# Patient Record
Sex: Male | Born: 1965
Health system: Southern US, Community
[De-identification: ages and names within clinical notes are randomized; demographics above are authoritative.]

## PROBLEM LIST (undated history)

## (undated) DIAGNOSIS — I10 Essential (primary) hypertension: Secondary | ICD-10-CM

## (undated) DIAGNOSIS — E119 Type 2 diabetes mellitus without complications: Secondary | ICD-10-CM

## (undated) HISTORY — PX: OTHER SURGICAL HISTORY: SHX169

---

## 2016-10-18 ENCOUNTER — Emergency Department (HOSPITAL_COMMUNITY): Payer: BLUE CROSS/BLUE SHIELD

## 2016-10-18 ENCOUNTER — Encounter (HOSPITAL_COMMUNITY): Payer: Self-pay | Admitting: *Deleted

## 2016-10-18 ENCOUNTER — Emergency Department (HOSPITAL_COMMUNITY)
Admission: EM | Admit: 2016-10-18 | Discharge: 2016-10-18 | Disposition: A | Discharge status: Home / Self Care | Payer: BLUE CROSS/BLUE SHIELD | Attending: Emergency Medicine | Admitting: Emergency Medicine

## 2016-10-18 DIAGNOSIS — R110 Nausea: Secondary | ICD-10-CM

## 2016-10-18 DIAGNOSIS — R079 Chest pain, unspecified: Secondary | ICD-10-CM | POA: Diagnosis present

## 2016-10-18 DIAGNOSIS — R42 Dizziness and giddiness: Secondary | ICD-10-CM

## 2016-10-18 HISTORY — DX: Type 2 diabetes mellitus without complications: E11.9

## 2016-10-18 HISTORY — DX: Essential (primary) hypertension: I10

## 2016-10-18 LAB — CBC
HCT: 43.5 % (ref 39.0–52.0)
Hemoglobin: 14.6 g/dL (ref 13.0–17.0)
MCH: 29.1 pg (ref 26.0–34.0)
MCHC: 33.6 g/dL (ref 30.0–36.0)
MCV: 86.8 fL (ref 78.0–100.0)
PLATELETS: 210 10*3/uL (ref 150–400)
RBC: 5.01 MIL/uL (ref 4.22–5.81)
RDW: 13.1 % (ref 11.5–15.5)
WBC: 6.9 10*3/uL (ref 4.0–10.5)

## 2016-10-18 LAB — I-STAT TROPONIN, ED: TROPONIN I, POC: 0 ng/mL (ref 0.00–0.08)

## 2016-10-18 LAB — BASIC METABOLIC PANEL
Anion gap: 10 (ref 5–15)
BUN: 23 mg/dL — AB (ref 6–20)
CO2: 21 mmol/L — ABNORMAL LOW (ref 22–32)
CREATININE: 0.86 mg/dL (ref 0.61–1.24)
Calcium: 9.6 mg/dL (ref 8.9–10.3)
Chloride: 106 mmol/L (ref 101–111)
Glucose, Bld: 199 mg/dL — ABNORMAL HIGH (ref 65–99)
Potassium: 4.1 mmol/L (ref 3.5–5.1)
SODIUM: 137 mmol/L (ref 135–145)

## 2016-10-18 LAB — CBG MONITORING, ED: GLUCOSE-CAPILLARY: 209 mg/dL — AB (ref 65–99)

## 2016-10-18 MED ORDER — MECLIZINE HCL 25 MG PO TABS: 25.0000 mg | ORAL_TABLET | Freq: Three times a day (TID) | ORAL | 0 refills | Status: DC | PRN

## 2016-10-18 MED ORDER — SODIUM CHLORIDE 0.9 % IV BOLUS (SEPSIS)
1000.0000 mL | Freq: Once | INTRAVENOUS | Status: AC
Start: 2016-10-18 — End: 2016-10-18
  Administered 2016-10-18: 1000 mL via INTRAVENOUS

## 2016-10-18 MED ORDER — ONDANSETRON 4 MG PO TBDP
4.0000 mg | ORAL_TABLET | Freq: Once | ORAL | Status: AC
  Administered 2016-10-18: 4 mg via ORAL

## 2016-10-18 MED ORDER — ONDANSETRON 4 MG PO TBDP
ORAL_TABLET | ORAL | Status: AC
  Filled 2016-10-18: qty 1

## 2016-10-18 MED ORDER — MECLIZINE HCL 25 MG PO TABS
25.0000 mg | ORAL_TABLET | Freq: Once | ORAL | Status: AC
  Administered 2016-10-18: 25 mg via ORAL
  Filled 2016-10-18: qty 1
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
## 2016-10-18 NOTE — ED Notes (Signed)
Pt in room and changing in gown at this time.

## 2016-10-18 NOTE — ED Notes (Signed)
Pt ambulatory on discharge with no complaints. VSS.

## 2016-10-18 NOTE — Discharge Instructions (Signed)
RETURN TO ER IF YOU HAVE ANY BAD HEADACHE, SUDDEN VISUAL CHANGES, NECK PAIN ASSOCIATED WITH HEADACHE, ARM OR LEG WEAKNESS, OR REPEATED VOMITING.

## 2016-10-18 NOTE — ED Triage Notes (Signed)
Pt states acute onset dizziness and chest pressure 1130. Symptoms accompanied by nausea and last a minute, intermittent and increased with movement.  No focal deficits.  No headache, though states some blurred vision in both eyes.

## 2017-01-18 ENCOUNTER — Ambulatory Visit (INDEPENDENT_AMBULATORY_CARE_PROVIDER_SITE_OTHER): Payer: BLUE CROSS/BLUE SHIELD | Admitting: Physician Assistant

## 2017-01-18 ENCOUNTER — Encounter: Payer: Self-pay | Admitting: Physician Assistant

## 2017-01-18 VITALS — BP 126/74 | HR 74 | Temp 98.1°F | Resp 18 | Ht 71.0 in | Wt 243.0 lb

## 2017-01-18 DIAGNOSIS — Z23 Encounter for immunization: Secondary | ICD-10-CM | POA: Diagnosis not present

## 2017-01-18 DIAGNOSIS — Z7689 Persons encountering health services in other specified circumstances: Secondary | ICD-10-CM

## 2017-01-18 DIAGNOSIS — Z794 Long term (current) use of insulin: Secondary | ICD-10-CM

## 2017-01-18 DIAGNOSIS — Z0000 Encounter for general adult medical examination without abnormal findings: Secondary | ICD-10-CM

## 2017-01-18 DIAGNOSIS — E119 Type 2 diabetes mellitus without complications: Secondary | ICD-10-CM | POA: Diagnosis not present

## 2017-01-18 MED ORDER — LISINOPRIL 10 MG PO TABS: 10.0000 mg | ORAL_TABLET | Freq: Every day | ORAL | 3 refills | Status: DC

## 2017-01-18 MED ORDER — METFORMIN HCL 500 MG PO TABS: 500.0000 mg | ORAL_TABLET | Freq: Two times a day (BID) | ORAL | 1 refills | Status: DC

## 2017-01-18 MED ORDER — ASPIRIN 81 MG PO TABS: 81.0000 mg | ORAL_TABLET | Freq: Every day | ORAL | 11 refills | Status: AC
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
## 2017-01-19 ENCOUNTER — Other Ambulatory Visit: Payer: BLUE CROSS/BLUE SHIELD

## 2017-01-19 DIAGNOSIS — Z0000 Encounter for general adult medical examination without abnormal findings: Secondary | ICD-10-CM

## 2017-01-19 DIAGNOSIS — E119 Type 2 diabetes mellitus without complications: Secondary | ICD-10-CM

## 2017-01-19 LAB — MICROALBUMIN, URINE: Microalb, Ur: <0.2 mg/dL

## 2017-01-20 LAB — COMPLETE METABOLIC PANEL WITH GFR
AG RATIO: 1.4 (calc) (ref 1.0–2.5)
ALBUMIN MSPROF: 3.6 g/dL (ref 3.6–5.1)
ALKALINE PHOSPHATASE (APISO): 81 U/L (ref 40–115)
ALT: 21 U/L (ref 9–46)
AST: 16 U/L (ref 10–35)
BILIRUBIN TOTAL: 0.6 mg/dL (ref 0.2–1.2)
BUN: 20 mg/dL (ref 7–25)
CHLORIDE: 101 mmol/L (ref 98–110)
CO2: 25 mmol/L (ref 20–32)
Calcium: 8.9 mg/dL (ref 8.6–10.3)
Creat: 0.9 mg/dL (ref 0.70–1.33)
GFR, Est African American: 115 mL/min/{1.73_m2} (ref >=60)
GFR, Est Non African American: 99 mL/min/{1.73_m2} (ref >=60)
GLOBULIN: 2.6 g/dL (ref 1.9–3.7)
GLUCOSE: 316 mg/dL — AB (ref 65–99)
POTASSIUM: 4.3 mmol/L (ref 3.5–5.3)
SODIUM: 134 mmol/L — AB (ref 135–146)
Total Protein: 6.2 g/dL (ref 6.1–8.1)

## 2017-01-20 LAB — CBC WITH DIFFERENTIAL/PLATELET
Basophils Absolute: 18 cells/uL (ref 0–200)
Basophils Relative: 0.3 %
EOS PCT: 2 %
Eosinophils Absolute: 120 cells/uL (ref 15–500)
HCT: 44.5 % (ref 38.5–50.0)
Hemoglobin: 14.8 g/dL (ref 13.2–17.1)
Lymphs Abs: 1626 cells/uL (ref 850–3900)
MCH: 28.5 pg (ref 27.0–33.0)
MCHC: 33.3 g/dL (ref 32.0–36.0)
MCV: 85.7 fL (ref 80.0–100.0)
MPV: 11.4 fL (ref 7.5–12.5)
Monocytes Relative: 9.7 %
NEUTROS PCT: 60.9 %
Neutro Abs: 3654 cells/uL (ref 1500–7800)
PLATELETS: 183 10*3/uL (ref 140–400)
RBC: 5.19 10*6/uL (ref 4.20–5.80)
RDW: 12.4 % (ref 11.0–15.0)
TOTAL LYMPHOCYTE: 27.1 %
WBC mixed population: 582 cells/uL (ref 200–950)
WBC: 6 10*3/uL (ref 3.8–10.8)

## 2017-01-20 LAB — HEMOGLOBIN A1C
Hgb A1c MFr Bld: 13.7 % of total Hgb — ABNORMAL HIGH (ref <5.7)
Mean Plasma Glucose: 346 (calc)
eAG (mmol/L): 19.2 (calc)

## 2017-01-20 LAB — LIPID PANEL
CHOL/HDL RATIO: 6.9 (calc) — AB (ref <5.0)
Cholesterol: 263 mg/dL — ABNORMAL HIGH (ref <200)
HDL: 38 mg/dL — AB (ref >40)
NON-HDL CHOLESTEROL (CALC): 225 mg/dL — AB (ref <130)
Triglycerides: 497 mg/dL — ABNORMAL HIGH (ref <150)

## 2017-01-20 LAB — PSA: PSA: 0.4 ng/mL (ref <=4.0)

## 2017-01-20 LAB — TSH: TSH: 0.89 mIU/L (ref 0.40–4.50)

## 2017-01-22 ENCOUNTER — Encounter: Payer: Self-pay | Admitting: Physician Assistant

## 2017-01-22 ENCOUNTER — Ambulatory Visit (INDEPENDENT_AMBULATORY_CARE_PROVIDER_SITE_OTHER): Payer: BLUE CROSS/BLUE SHIELD | Admitting: Physician Assistant

## 2017-01-22 VITALS — BP 100/66 | HR 74 | Temp 98.3°F | Resp 16 | Ht 71.0 in | Wt 231.0 lb

## 2017-01-22 DIAGNOSIS — E119 Type 2 diabetes mellitus without complications: Secondary | ICD-10-CM

## 2017-01-22 DIAGNOSIS — Z794 Long term (current) use of insulin: Secondary | ICD-10-CM | POA: Diagnosis not present

## 2017-01-22 DIAGNOSIS — N521 Erectile dysfunction due to diseases classified elsewhere: Secondary | ICD-10-CM | POA: Diagnosis not present

## 2017-01-22 DIAGNOSIS — N529 Male erectile dysfunction, unspecified: Secondary | ICD-10-CM | POA: Insufficient documentation

## 2017-01-22 MED ORDER — INSULIN DEGLUDEC 200 UNIT/ML ~~LOC~~ SOPN: 40.0000 [IU] | PEN_INJECTOR | Freq: Every day | SUBCUTANEOUS | 3 refills | Status: DC

## 2017-01-22 MED ORDER — SILDENAFIL CITRATE 100 MG PO TABS: 50.0000 mg | ORAL_TABLET | Freq: Every day | ORAL | 11 refills | Status: DC | PRN

## 2017-01-22 MED ORDER — METFORMIN HCL 1000 MG PO TABS: 1000.0000 mg | ORAL_TABLET | Freq: Two times a day (BID) | ORAL | 3 refills | Status: DC
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
## 2017-01-24 ENCOUNTER — Telehealth: Payer: Self-pay | Admitting: *Deleted

## 2017-01-24 DIAGNOSIS — E119 Type 2 diabetes mellitus without complications: Secondary | ICD-10-CM

## 2017-01-24 DIAGNOSIS — Z794 Long term (current) use of insulin: Principal | ICD-10-CM

## 2017-01-24 NOTE — Telephone Encounter (Signed)
Received request from pharmacy for PA on Tresiba.  PA submitted.   Dx: E11.65- DM with hyperglycemia (A1C >14%)  Evaristo Buryresiba is now an excellent alternative for what doctors call basal insulin. Basal insulin is a type of insulin that lasts long periods of time and helps control blood sugars between meals and overnight. Several changes were made to slow the absorption of Tresiba, making its effect on lower blood sugar more predictable. As compared to regular insulin, Evaristo Buryresiba contains an amino acid deletion and a special bond with a fatty acid. When combined with zinc and phenol, degludec (the generic name of Evaristo Buryresiba) forms multihexamers under the skin. Once absorbed, it reversibly binds to the main protein in the blood, called albumin. The end result of these modifications is a new class of insulin referred to as "ultra-long-acting basal insulin".

## 2017-01-24 NOTE — Telephone Encounter (Signed)
Your PA has been faxed to the plan as a paper copy. Please contact the plan directly if you haven't received a determination in a typical timeframe.  You will be notified of the determination electronically and via fax. 

## 2017-01-30 MED ORDER — INSULIN DEGLUDEC 200 UNIT/ML ~~LOC~~ SOPN: 40.0000 [IU] | PEN_INJECTOR | Freq: Every day | SUBCUTANEOUS | 3 refills | Status: DC

## 2017-01-30 NOTE — Telephone Encounter (Signed)
Received PA determination.  ° °PA approved.  ° °Pharmacy made aware.  °

## 2017-02-01 ENCOUNTER — Telehealth: Payer: Self-pay | Admitting: *Deleted

## 2017-02-01 NOTE — Telephone Encounter (Signed)
Your PA has been faxed to the plan as a paper copy. Please contact the plan directly if you haven't received a determination in a typical timeframe.  You will be notified of the determination via fax. 

## 2017-02-01 NOTE — Telephone Encounter (Signed)
Received request from pharmacy for PA on Sildenafil.   PA submitted.   Dx: N52.1- ED

## 2017-02-05 ENCOUNTER — Ambulatory Visit (INDEPENDENT_AMBULATORY_CARE_PROVIDER_SITE_OTHER): Payer: BLUE CROSS/BLUE SHIELD | Admitting: Physician Assistant

## 2017-02-05 ENCOUNTER — Encounter: Payer: Self-pay | Admitting: Physician Assistant

## 2017-02-05 VITALS — BP 108/78 | HR 71 | Temp 98.3°F | Resp 16 | Ht 71.0 in | Wt 234.6 lb

## 2017-02-05 DIAGNOSIS — M7022 Olecranon bursitis, left elbow: Secondary | ICD-10-CM

## 2017-02-05 DIAGNOSIS — N529 Male erectile dysfunction, unspecified: Secondary | ICD-10-CM

## 2017-02-05 DIAGNOSIS — Z794 Long term (current) use of insulin: Secondary | ICD-10-CM

## 2017-02-05 DIAGNOSIS — E119 Type 2 diabetes mellitus without complications: Secondary | ICD-10-CM

## 2017-02-05 MED ORDER — PREDNISONE 20 MG PO TABS
ORAL_TABLET | ORAL | 0 refills | Status: DC
Start: 2017-02-05 — End: 2017-04-19

## 2017-02-05 MED ORDER — SILDENAFIL CITRATE 20 MG PO TABS: ORAL_TABLET | ORAL | 2 refills | Status: DC
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
## 2017-02-08 ENCOUNTER — Telehealth: Payer: Self-pay

## 2017-02-08 NOTE — Telephone Encounter (Signed)
Drug Sildenafil Citrate  OR TABS Form [employer] [place] Form Original Claim Info 75 PA assistance available. Resubmit claim with 8 in PA Type Code and 4444 in PriorAuthNumber

## 2017-02-08 NOTE — Telephone Encounter (Signed)
No I just did this today

## 2017-02-14 NOTE — Telephone Encounter (Signed)
Received PA determination.   PA for Sidenafil is denied. Request can only be approved if medication is being used to treat pulmonary arterial hypertension.   Patient will have to pay out of pocket for medication.

## 2017-04-19 ENCOUNTER — Ambulatory Visit (INDEPENDENT_AMBULATORY_CARE_PROVIDER_SITE_OTHER): Payer: BLUE CROSS/BLUE SHIELD | Admitting: Physician Assistant

## 2017-04-19 ENCOUNTER — Other Ambulatory Visit: Payer: Self-pay

## 2017-04-19 ENCOUNTER — Encounter: Payer: Self-pay | Admitting: Physician Assistant

## 2017-04-19 VITALS — BP 110/78 | HR 88 | Temp 98.8°F | Resp 16 | Wt 236.8 lb

## 2017-04-19 DIAGNOSIS — E119 Type 2 diabetes mellitus without complications: Secondary | ICD-10-CM | POA: Diagnosis not present

## 2017-04-19 DIAGNOSIS — Z794 Long term (current) use of insulin: Secondary | ICD-10-CM | POA: Diagnosis not present

## 2017-04-20 ENCOUNTER — Other Ambulatory Visit: Payer: BLUE CROSS/BLUE SHIELD

## 2017-04-20 DIAGNOSIS — Z794 Long term (current) use of insulin: Principal | ICD-10-CM

## 2017-04-20 DIAGNOSIS — E119 Type 2 diabetes mellitus without complications: Secondary | ICD-10-CM

## 2017-04-20 MED ORDER — SILDENAFIL CITRATE 20 MG PO TABS: ORAL_TABLET | ORAL | 3 refills | Status: AC
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
## 2017-04-21 LAB — HEPATIC FUNCTION PANEL
AG Ratio: 1.6 (calc) (ref 1.0–2.5)
ALBUMIN MSPROF: 3.9 g/dL (ref 3.6–5.1)
ALKALINE PHOSPHATASE (APISO): 53 U/L (ref 40–115)
ALT: 15 U/L (ref 9–46)
AST: 16 U/L (ref 10–35)
BILIRUBIN DIRECT: 0.1 mg/dL (ref 0.0–0.2)
BILIRUBIN TOTAL: 0.5 mg/dL (ref 0.2–1.2)
Globulin: 2.4 g/dL (calc) (ref 1.9–3.7)
Indirect Bilirubin: 0.4 mg/dL (calc) (ref 0.2–1.2)
Total Protein: 6.3 g/dL (ref 6.1–8.1)

## 2017-04-21 LAB — LIPID PANEL
Cholesterol: 192 mg/dL (ref <200)
HDL: 50 mg/dL (ref >40)
LDL Cholesterol (Calc): 117 mg/dL (calc) — ABNORMAL HIGH
Non-HDL Cholesterol (Calc): 142 mg/dL (calc) — ABNORMAL HIGH (ref <130)
Total CHOL/HDL Ratio: 3.8 (calc) (ref <5.0)
Triglycerides: 140 mg/dL (ref <150)

## 2017-04-21 LAB — HEMOGLOBIN A1C
Hgb A1c MFr Bld: 7.7 % of total Hgb — ABNORMAL HIGH (ref <5.7)
Mean Plasma Glucose: 174 (calc)
eAG (mmol/L): 9.7 (calc)

## 2017-04-25 ENCOUNTER — Other Ambulatory Visit: Payer: Self-pay

## 2017-04-25 DIAGNOSIS — Z794 Long term (current) use of insulin: Principal | ICD-10-CM

## 2017-04-25 DIAGNOSIS — E119 Type 2 diabetes mellitus without complications: Secondary | ICD-10-CM

## 2017-04-25 MED ORDER — METFORMIN HCL 1000 MG PO TABS: 1000.0000 mg | ORAL_TABLET | Freq: Two times a day (BID) | ORAL | 3 refills | Status: AC

## 2017-04-25 MED ORDER — SIMVASTATIN 10 MG PO TABS: 10.0000 mg | ORAL_TABLET | Freq: Every day | ORAL | 0 refills | Status: DC

## 2017-04-26 ENCOUNTER — Telehealth: Payer: Self-pay

## 2017-04-26 NOTE — Telephone Encounter (Signed)
Prior authorization started for sildenafil 20 mg  Drug Sildenafil Citrate 20MG  OR TABS Form [employer] Electronic PA Form   Hoyle SauerAlan Meininger Key: Corena [id number] - PA Case ID: [id number] - Rx #: [id number]

## 2017-05-16 NOTE — Telephone Encounter (Addendum)
Prior authorization for Sildenafil 20 mg has been denied due to medical necessity and patient not having pulmonary arterial dysfunction.   Pharmacy aware they tried to run it through as brand and generic name

## 2017-06-06 ENCOUNTER — Other Ambulatory Visit: Payer: BLUE CROSS/BLUE SHIELD

## 2017-06-06 DIAGNOSIS — Z794 Long term (current) use of insulin: Principal | ICD-10-CM

## 2017-06-06 DIAGNOSIS — E119 Type 2 diabetes mellitus without complications: Secondary | ICD-10-CM

## 2017-06-07 LAB — LIPID PANEL
Cholesterol: 174 mg/dL (ref <200)
HDL: 51 mg/dL (ref >40)
LDL Cholesterol (Calc): 95 mg/dL (calc)
NON-HDL CHOLESTEROL (CALC): 123 mg/dL (ref <130)
Total CHOL/HDL Ratio: 3.4 (calc) (ref <5.0)
Triglycerides: 185 mg/dL — ABNORMAL HIGH (ref <150)

## 2017-06-07 LAB — HEPATIC FUNCTION PANEL
AG RATIO: 1.7 (calc) (ref 1.0–2.5)
ALKALINE PHOSPHATASE (APISO): 56 U/L (ref 40–115)
ALT: 18 U/L (ref 9–46)
AST: 20 U/L (ref 10–35)
Albumin: 4 g/dL (ref 3.6–5.1)
Bilirubin, Direct: 0.1 mg/dL (ref 0.0–0.2)
GLOBULIN: 2.3 g/dL (ref 1.9–3.7)
Indirect Bilirubin: 0.3 mg/dL (calc) (ref 0.2–1.2)
TOTAL PROTEIN: 6.3 g/dL (ref 6.1–8.1)
Total Bilirubin: 0.4 mg/dL (ref 0.2–1.2)

## 2017-06-22 ENCOUNTER — Other Ambulatory Visit: Payer: Self-pay

## 2017-06-22 MED ORDER — INSULIN PEN NEEDLE 32G X 4 MM MISC: 5 refills | Status: DC

## 2017-07-19 ENCOUNTER — Encounter: Payer: Self-pay | Admitting: Physician Assistant

## 2017-07-19 ENCOUNTER — Other Ambulatory Visit: Payer: Self-pay

## 2017-07-19 ENCOUNTER — Ambulatory Visit: Payer: BLUE CROSS/BLUE SHIELD | Admitting: Physician Assistant

## 2017-07-19 VITALS — BP 128/72 | HR 66 | Temp 98.1°F | Resp 16 | Ht 71.0 in | Wt 234.0 lb

## 2017-07-19 DIAGNOSIS — I10 Essential (primary) hypertension: Secondary | ICD-10-CM | POA: Diagnosis not present

## 2017-07-19 DIAGNOSIS — E785 Hyperlipidemia, unspecified: Secondary | ICD-10-CM

## 2017-07-19 DIAGNOSIS — N529 Male erectile dysfunction, unspecified: Secondary | ICD-10-CM | POA: Diagnosis not present

## 2017-07-19 DIAGNOSIS — E119 Type 2 diabetes mellitus without complications: Secondary | ICD-10-CM | POA: Diagnosis not present

## 2017-07-19 DIAGNOSIS — Z794 Long term (current) use of insulin: Secondary | ICD-10-CM
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
## 2017-07-20 LAB — HEMOGLOBIN A1C
EAG (MMOL/L): 8.2 (calc)
Hgb A1c MFr Bld: 6.8 % of total Hgb — ABNORMAL HIGH (ref <5.7)
Mean Plasma Glucose: 148 (calc)

## 2018-02-21 ENCOUNTER — Encounter: Payer: Self-pay | Admitting: Physician Assistant

## 2018-02-21 ENCOUNTER — Ambulatory Visit: Payer: BLUE CROSS/BLUE SHIELD | Admitting: Physician Assistant

## 2018-02-21 VITALS — BP 118/72 | HR 67 | Temp 98.3°F | Resp 16 | Wt 239.1 lb

## 2018-02-21 DIAGNOSIS — E785 Hyperlipidemia, unspecified: Secondary | ICD-10-CM

## 2018-02-21 DIAGNOSIS — Z125 Encounter for screening for malignant neoplasm of prostate: Secondary | ICD-10-CM

## 2018-02-21 DIAGNOSIS — I10 Essential (primary) hypertension: Secondary | ICD-10-CM

## 2018-02-21 DIAGNOSIS — N529 Male erectile dysfunction, unspecified: Secondary | ICD-10-CM

## 2018-02-21 DIAGNOSIS — Z794 Long term (current) use of insulin: Secondary | ICD-10-CM

## 2018-02-21 DIAGNOSIS — E119 Type 2 diabetes mellitus without complications: Secondary | ICD-10-CM

## 2018-02-21 MED ORDER — SIMVASTATIN 10 MG PO TABS: 10.0000 mg | ORAL_TABLET | Freq: Every day | ORAL | 1 refills | Status: DC
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
## 2018-02-21 NOTE — Addendum Note (Signed)
Addended by: Allayne Butcher B on: 02/21/2018 08:47 AM   Modules accepted: Orders

## 2018-02-22 LAB — HEMOGLOBIN A1C
HEMOGLOBIN A1C: 7.4 %{Hb} — AB (ref <5.7)
Mean Plasma Glucose: 166 (calc)
eAG (mmol/L): 9.2 (calc)

## 2018-02-22 LAB — COMPLETE METABOLIC PANEL WITH GFR
AG RATIO: 1.6 (calc) (ref 1.0–2.5)
ALT: 25 U/L (ref 9–46)
AST: 24 U/L (ref 10–35)
Albumin: 4.1 g/dL (ref 3.6–5.1)
Alkaline phosphatase (APISO): 63 U/L (ref 40–115)
BILIRUBIN TOTAL: 0.4 mg/dL (ref 0.2–1.2)
BUN: 24 mg/dL (ref 7–25)
CALCIUM: 9.5 mg/dL (ref 8.6–10.3)
CHLORIDE: 105 mmol/L (ref 98–110)
CO2: 22 mmol/L (ref 20–32)
Creat: 0.89 mg/dL (ref 0.70–1.33)
GFR, EST AFRICAN AMERICAN: 115 mL/min/{1.73_m2} (ref >=60)
GFR, Est Non African American: 99 mL/min/{1.73_m2} (ref >=60)
GLUCOSE: 124 mg/dL — AB (ref 65–99)
Globulin: 2.5 g/dL (calc) (ref 1.9–3.7)
POTASSIUM: 4.4 mmol/L (ref 3.5–5.3)
Sodium: 139 mmol/L (ref 135–146)
TOTAL PROTEIN: 6.6 g/dL (ref 6.1–8.1)

## 2018-02-22 LAB — LIPID PANEL
Cholesterol: 220 mg/dL — ABNORMAL HIGH (ref <200)
HDL: 41 mg/dL (ref >40)
LDL Cholesterol (Calc): 139 mg/dL (calc) — ABNORMAL HIGH
Non-HDL Cholesterol (Calc): 179 mg/dL (calc) — ABNORMAL HIGH (ref <130)
TRIGLYCERIDES: 262 mg/dL — AB (ref <150)
Total CHOL/HDL Ratio: 5.4 (calc) — ABNORMAL HIGH (ref <5.0)

## 2018-02-22 LAB — MICROALBUMIN, URINE: Microalb, Ur: 1.1 mg/dL

## 2018-02-22 LAB — PSA: PSA: 0.6 ng/mL (ref <=4.0)

## 2018-02-27 ENCOUNTER — Other Ambulatory Visit: Payer: Self-pay

## 2018-02-27 MED ORDER — SIMVASTATIN 10 MG PO TABS: 10.0000 mg | ORAL_TABLET | Freq: Every day | ORAL | 1 refills | Status: AC

## 2018-02-27 MED ORDER — SITAGLIPTIN PHOSPHATE 100 MG PO TABS: 100.0000 mg | ORAL_TABLET | Freq: Every day | ORAL | 5 refills | Status: AC

## 2018-04-22 DIAGNOSIS — E785 Hyperlipidemia, unspecified: Secondary | ICD-10-CM | POA: Diagnosis not present

## 2018-04-22 DIAGNOSIS — E119 Type 2 diabetes mellitus without complications: Secondary | ICD-10-CM | POA: Diagnosis not present

## 2018-04-22 DIAGNOSIS — H8110 Benign paroxysmal vertigo, unspecified ear: Secondary | ICD-10-CM | POA: Diagnosis not present

## 2018-04-22 DIAGNOSIS — I10 Essential (primary) hypertension: Secondary | ICD-10-CM | POA: Diagnosis not present

## 2018-04-23 ENCOUNTER — Other Ambulatory Visit: Payer: Self-pay | Admitting: *Deleted

## 2018-04-23 DIAGNOSIS — E119 Type 2 diabetes mellitus without complications: Secondary | ICD-10-CM

## 2018-04-23 MED ORDER — LISINOPRIL 10 MG PO TABS: 10.0000 mg | ORAL_TABLET | Freq: Every day | ORAL | 3 refills | Status: AC

## 2018-04-23 MED ORDER — LISINOPRIL 10 MG PO TABS: 10.0000 mg | ORAL_TABLET | Freq: Every day | ORAL | 3 refills | Status: DC

## 2018-04-23 NOTE — Addendum Note (Signed)
Addended by: Phillips OdorSIX, [place]ceola Holian H on: 04/23/2018 09:24 AM   Modules accepted: Orders

## 2018-06-05 ENCOUNTER — Ambulatory Visit: Payer: BLUE CROSS/BLUE SHIELD | Admitting: Physician Assistant

## 2018-06-29 ENCOUNTER — Encounter (HOSPITAL_COMMUNITY): Payer: Self-pay

## 2018-06-29 ENCOUNTER — Other Ambulatory Visit: Payer: Self-pay

## 2018-06-29 ENCOUNTER — Emergency Department (HOSPITAL_COMMUNITY): Payer: Managed Care, Other (non HMO)

## 2018-06-29 ENCOUNTER — Emergency Department (HOSPITAL_COMMUNITY)
Admission: EM | Admit: 2018-06-29 | Discharge: 2018-06-29 | Disposition: A | Discharge status: Home / Self Care | Payer: Managed Care, Other (non HMO) | Attending: Emergency Medicine | Admitting: Emergency Medicine

## 2018-06-29 DIAGNOSIS — Z7984 Long term (current) use of oral hypoglycemic drugs: Secondary | ICD-10-CM | POA: Insufficient documentation

## 2018-06-29 DIAGNOSIS — E119 Type 2 diabetes mellitus without complications: Secondary | ICD-10-CM | POA: Insufficient documentation

## 2018-06-29 DIAGNOSIS — I10 Essential (primary) hypertension: Secondary | ICD-10-CM | POA: Insufficient documentation

## 2018-06-29 DIAGNOSIS — N201 Calculus of ureter: Secondary | ICD-10-CM

## 2018-06-29 DIAGNOSIS — R109 Unspecified abdominal pain: Secondary | ICD-10-CM | POA: Diagnosis present

## 2018-06-29 DIAGNOSIS — Z79899 Other long term (current) drug therapy: Secondary | ICD-10-CM | POA: Diagnosis not present

## 2018-06-29 DIAGNOSIS — Z7982 Long term (current) use of aspirin: Secondary | ICD-10-CM | POA: Insufficient documentation

## 2018-06-29 DIAGNOSIS — N23 Unspecified renal colic: Secondary | ICD-10-CM | POA: Diagnosis not present

## 2018-06-29 LAB — CBC WITH DIFFERENTIAL/PLATELET
ABS IMMATURE GRANULOCYTES: 0.03 10*3/uL (ref 0.00–0.07)
Basophils Absolute: 0 10*3/uL (ref 0.0–0.1)
Basophils Relative: 0 %
EOS ABS: 0 10*3/uL (ref 0.0–0.5)
Eosinophils Relative: 0 %
HEMATOCRIT: 44.1 % (ref 39.0–52.0)
Hemoglobin: 13.9 g/dL (ref 13.0–17.0)
IMMATURE GRANULOCYTES: 0 %
LYMPHS ABS: 0.9 10*3/uL (ref 0.7–4.0)
Lymphocytes Relative: 9 %
MCH: 27.9 pg (ref 26.0–34.0)
MCHC: 31.5 g/dL (ref 30.0–36.0)
MCV: 88.4 fL (ref 80.0–100.0)
MONOS PCT: 5 %
Monocytes Absolute: 0.5 10*3/uL (ref 0.1–1.0)
NEUTROS PCT: 86 %
Neutro Abs: 8.3 10*3/uL — ABNORMAL HIGH (ref 1.7–7.7)
Platelets: 200 10*3/uL (ref 150–400)
RBC: 4.99 MIL/uL (ref 4.22–5.81)
RDW: 12.5 % (ref 11.5–15.5)
WBC: 9.7 10*3/uL (ref 4.0–10.5)
nRBC: 0 % (ref 0.0–0.2)

## 2018-06-29 LAB — URINALYSIS, ROUTINE W REFLEX MICROSCOPIC
Bacteria, UA: NONE SEEN
Bilirubin Urine: NEGATIVE
Glucose, UA: NEGATIVE mg/dL
Ketones, ur: NEGATIVE mg/dL
Leukocytes,Ua: NEGATIVE
Nitrite: NEGATIVE
Protein, ur: NEGATIVE mg/dL
RBC / HPF: >50 RBC/hpf — ABNORMAL HIGH (ref 0–5)
Specific Gravity, Urine: 1.024 (ref 1.005–1.030)
pH: 6 (ref 5.0–8.0)

## 2018-06-29 LAB — BASIC METABOLIC PANEL
ANION GAP: 10 (ref 5–15)
BUN: 28 mg/dL — ABNORMAL HIGH (ref 6–20)
CHLORIDE: 101 mmol/L (ref 98–111)
CO2: 24 mmol/L (ref 22–32)
Calcium: 8.9 mg/dL (ref 8.9–10.3)
Creatinine, Ser: 1.15 mg/dL (ref 0.61–1.24)
GFR calc Af Amer: >60 mL/min (ref >60)
GLUCOSE: 194 mg/dL — AB (ref 70–99)
POTASSIUM: 4 mmol/L (ref 3.5–5.1)
Sodium: 135 mmol/L (ref 135–145)

## 2018-06-29 MED ORDER — TAMSULOSIN HCL 0.4 MG PO CAPS: ORAL_CAPSULE | ORAL | 0 refills | Status: DC

## 2018-06-29 MED ORDER — OXYCODONE-ACETAMINOPHEN 5-325 MG PO TABS: 1.0000 | ORAL_TABLET | ORAL | 0 refills | Status: DC | PRN

## 2018-06-29 MED ORDER — ONDANSETRON HCL 4 MG/2ML IJ SOLN
4.0000 mg | Freq: Once | INTRAMUSCULAR | Status: AC
  Administered 2018-06-29: 4 mg via INTRAVENOUS
  Filled 2018-06-29: qty 2

## 2018-06-29 MED ORDER — FENTANYL CITRATE (PF) 100 MCG/2ML IJ SOLN
100.0000 ug | INTRAMUSCULAR | Status: DC | PRN
  Administered 2018-06-29: 100 ug via INTRAVENOUS
  Filled 2018-06-29: qty 2

## 2018-06-29 NOTE — Discharge Instructions (Addendum)
Take medications prescribed to help your discomfort.  Call your urologist on Monday morning for an appointment to be seen as soon as possible.  If your pain cannot be controlled go to the [facility] emergency department.

## 2018-06-29 NOTE — ED Triage Notes (Signed)
Pt reports intermittent r flank pain x 2 weeks.  Reports constant pain x 8 hours and vomiting that started 4 hours ago.
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
## 2019-07-25 ENCOUNTER — Emergency Department (HOSPITAL_COMMUNITY)
Admission: EM | Admit: 2019-07-25 | Discharge: 2019-07-25 | Disposition: A | Discharge status: Home / Self Care | Payer: Managed Care, Other (non HMO) | Attending: Emergency Medicine | Admitting: Emergency Medicine

## 2019-07-25 ENCOUNTER — Emergency Department (HOSPITAL_COMMUNITY): Payer: Managed Care, Other (non HMO)

## 2019-07-25 ENCOUNTER — Encounter (HOSPITAL_COMMUNITY): Payer: Self-pay | Admitting: Pediatrics

## 2019-07-25 ENCOUNTER — Other Ambulatory Visit: Payer: Self-pay

## 2019-07-25 DIAGNOSIS — E119 Type 2 diabetes mellitus without complications: Secondary | ICD-10-CM | POA: Insufficient documentation

## 2019-07-25 DIAGNOSIS — Z7984 Long term (current) use of oral hypoglycemic drugs: Secondary | ICD-10-CM | POA: Insufficient documentation

## 2019-07-25 DIAGNOSIS — I10 Essential (primary) hypertension: Secondary | ICD-10-CM | POA: Diagnosis not present

## 2019-07-25 DIAGNOSIS — S61421A Laceration with foreign body of right hand, initial encounter: Secondary | ICD-10-CM | POA: Diagnosis not present

## 2019-07-25 DIAGNOSIS — W268XXA Contact with other sharp object(s), not elsewhere classified, initial encounter: Secondary | ICD-10-CM | POA: Insufficient documentation

## 2019-07-25 DIAGNOSIS — Y9389 Activity, other specified: Secondary | ICD-10-CM | POA: Insufficient documentation

## 2019-07-25 DIAGNOSIS — Z7982 Long term (current) use of aspirin: Secondary | ICD-10-CM | POA: Diagnosis not present

## 2019-07-25 DIAGNOSIS — Y929 Unspecified place or not applicable: Secondary | ICD-10-CM | POA: Insufficient documentation

## 2019-07-25 DIAGNOSIS — Y999 Unspecified external cause status: Secondary | ICD-10-CM | POA: Insufficient documentation

## 2019-07-25 DIAGNOSIS — Z79899 Other long term (current) drug therapy: Secondary | ICD-10-CM | POA: Insufficient documentation

## 2019-07-25 MED ORDER — LIDOCAINE HCL (PF) 1 % IJ SOLN
30.0000 mL | Freq: Once | INTRAMUSCULAR | Status: AC
  Administered 2019-07-25: 13:00:00 30 mL
  Filled 2019-07-25: qty 30

## 2019-07-25 MED ORDER — BACITRACIN ZINC 500 UNIT/GM EX OINT: TOPICAL_OINTMENT | Freq: Every day | CUTANEOUS | Status: DC

## 2019-07-25 MED ORDER — HYDROCODONE-ACETAMINOPHEN 5-325 MG PO TABS: 1.0000 | ORAL_TABLET | ORAL | 0 refills | Status: DC | PRN

## 2019-07-25 MED ORDER — CEPHALEXIN 500 MG PO CAPS: 500.0000 mg | ORAL_CAPSULE | Freq: Three times a day (TID) | ORAL | 0 refills | Status: AC

## 2019-07-25 MED ORDER — CEPHALEXIN 500 MG PO CAPS: 500.0000 mg | ORAL_CAPSULE | Freq: Three times a day (TID) | ORAL | 0 refills | Status: DC

## 2019-07-25 NOTE — Discharge Instructions (Signed)
Take Keflex as prescribed and complete the full course. Take Norco as needed as prescribed for pain, do not drive or operate machinery if taking Norco. Call hand surgery today to schedule follow-up for Monday.  Return to the emergency room for any concerns throughout the weekend.

## 2019-07-25 NOTE — ED Triage Notes (Signed)
Arrived from urgent care; reported injured right hand w/ a grinder blade while cutting metal in half. Patient stated dressing was applied by urgent care staff; bleeding controlled. Reported TDAP vac given from urgent care as well.
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
## 2019-07-25 NOTE — ED Notes (Signed)
Splint applied to pt

## 2020-12-27 ENCOUNTER — Other Ambulatory Visit: Payer: Self-pay

## 2020-12-27 ENCOUNTER — Encounter (HOSPITAL_COMMUNITY): Payer: Self-pay

## 2020-12-27 ENCOUNTER — Emergency Department (HOSPITAL_COMMUNITY)
Admission: EM | Admit: 2020-12-27 | Discharge: 2020-12-28 | Disposition: A | Discharge status: Home / Self Care | Payer: Self-pay | Attending: Emergency Medicine | Admitting: Emergency Medicine

## 2020-12-27 DIAGNOSIS — N132 Hydronephrosis with renal and ureteral calculous obstruction: Secondary | ICD-10-CM | POA: Insufficient documentation

## 2020-12-27 DIAGNOSIS — I10 Essential (primary) hypertension: Secondary | ICD-10-CM | POA: Insufficient documentation

## 2020-12-27 DIAGNOSIS — Z79899 Other long term (current) drug therapy: Secondary | ICD-10-CM | POA: Insufficient documentation

## 2020-12-27 DIAGNOSIS — Z7982 Long term (current) use of aspirin: Secondary | ICD-10-CM | POA: Insufficient documentation

## 2020-12-27 DIAGNOSIS — E119 Type 2 diabetes mellitus without complications: Secondary | ICD-10-CM | POA: Insufficient documentation

## 2020-12-27 DIAGNOSIS — N23 Unspecified renal colic: Secondary | ICD-10-CM

## 2020-12-27 DIAGNOSIS — Z7984 Long term (current) use of oral hypoglycemic drugs: Secondary | ICD-10-CM | POA: Insufficient documentation

## 2020-12-27 LAB — BASIC METABOLIC PANEL
Anion gap: 9 (ref 5–15)
BUN: 25 mg/dL — ABNORMAL HIGH (ref 6–20)
CO2: 23 mmol/L (ref 22–32)
Calcium: 8.8 mg/dL — ABNORMAL LOW (ref 8.9–10.3)
Chloride: 103 mmol/L (ref 98–111)
Creatinine, Ser: 1.13 mg/dL (ref 0.61–1.24)
GFR, Estimated: >60 mL/min (ref >60)
Glucose, Bld: 253 mg/dL — ABNORMAL HIGH (ref 70–99)
Potassium: 3.6 mmol/L (ref 3.5–5.1)
Sodium: 135 mmol/L (ref 135–145)

## 2020-12-27 LAB — CBC
HCT: 43.2 % (ref 39.0–52.0)
Hemoglobin: 14.6 g/dL (ref 13.0–17.0)
MCH: 31.1 pg (ref 26.0–34.0)
MCHC: 33.8 g/dL (ref 30.0–36.0)
MCV: 91.9 fL (ref 80.0–100.0)
Platelets: 196 10*3/uL (ref 150–400)
RBC: 4.7 MIL/uL (ref 4.22–5.81)
RDW: 12.8 % (ref 11.5–15.5)
WBC: 10.2 10*3/uL (ref 4.0–10.5)
nRBC: 0 % (ref 0.0–0.2)

## 2020-12-27 MED ORDER — HYDROMORPHONE HCL 1 MG/ML IJ SOLN
1.0000 mg | Freq: Once | INTRAMUSCULAR | Status: AC
  Administered 2020-12-28: 1 mg via INTRAVENOUS
  Filled 2020-12-27: qty 1

## 2020-12-27 MED ORDER — ONDANSETRON HCL 4 MG/2ML IJ SOLN
4.0000 mg | Freq: Once | INTRAMUSCULAR | Status: AC
  Administered 2020-12-28: 4 mg via INTRAVENOUS
  Filled 2020-12-27: qty 2

## 2020-12-27 MED ORDER — SODIUM CHLORIDE 0.9 % IV SOLN: Freq: Once | INTRAVENOUS | Status: AC

## 2020-12-27 NOTE — ED Triage Notes (Signed)
Pt. States they are having lower right back pain that radiates to the right side of the abdomen. Pt. States this started around 1900.
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
## 2020-12-28 ENCOUNTER — Emergency Department (HOSPITAL_COMMUNITY): Payer: Self-pay

## 2020-12-28 LAB — URINALYSIS, ROUTINE W REFLEX MICROSCOPIC
Bacteria, UA: NONE SEEN
Bilirubin Urine: NEGATIVE
Glucose, UA: 150 mg/dL — AB
Ketones, ur: 5 mg/dL — AB
Nitrite: NEGATIVE
Protein, ur: 30 mg/dL — AB
Specific Gravity, Urine: 1.026 (ref 1.005–1.030)
pH: 5 (ref 5.0–8.0)

## 2020-12-28 MED ORDER — TAMSULOSIN HCL 0.4 MG PO CAPS: 0.4000 mg | ORAL_CAPSULE | Freq: Every day | ORAL | 0 refills | Status: AC

## 2020-12-28 MED ORDER — KETOROLAC TROMETHAMINE 30 MG/ML IJ SOLN
15.0000 mg | Freq: Once | INTRAMUSCULAR | Status: AC
  Administered 2020-12-28: 15 mg via INTRAVENOUS
  Filled 2020-12-28: qty 1

## 2020-12-28 MED ORDER — TAMSULOSIN HCL 0.4 MG PO CAPS: 0.4000 mg | ORAL_CAPSULE | Freq: Every day | ORAL | 0 refills | Status: DC

## 2020-12-28 MED ORDER — OXYCODONE-ACETAMINOPHEN 5-325 MG PO TABS: 1.0000 | ORAL_TABLET | ORAL | 0 refills | Status: AC | PRN

## 2020-12-28 MED ORDER — OXYCODONE-ACETAMINOPHEN 5-325 MG PO TABS: 2.0000 | ORAL_TABLET | ORAL | 0 refills | Status: AC | PRN

## 2020-12-28 NOTE — Discharge Instructions (Addendum)
Return to the ER if you develop fever or uncontrollable pain.  Otherwise schedule follow-up with urology.

## 2020-12-29 MED FILL — Oxycodone w/ Acetaminophen Tab 5-325 MG: ORAL | Qty: 6 | Status: AC

## 2022-04-28 IMAGING — CT CT RENAL STONE PROTOCOL
2 of 4 series · 16 of 46 positions shown, 18 images · non-contrast
Comparison: 06/29/2018

CLINICAL DATA: Low back/right flank pain

EXAM:
CT ABDOMEN AND PELVIS WITHOUT CONTRAST
TECHNIQUE: Multidetector CT imaging of the abdomen and pelvis was performed
following the standard protocol without IV contrast.

[Series 2: axial st · axial · 0.71mm/px · z∈[-113,+337]mm · 13 of 102 slices shown, 15 images]
[im 6/102  soft-tissue]
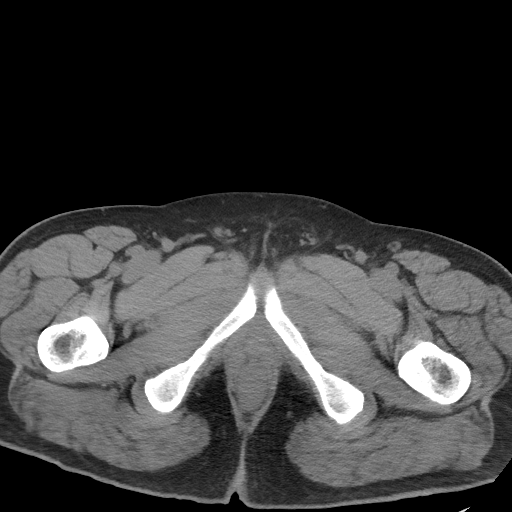
[im 6/102  bone]
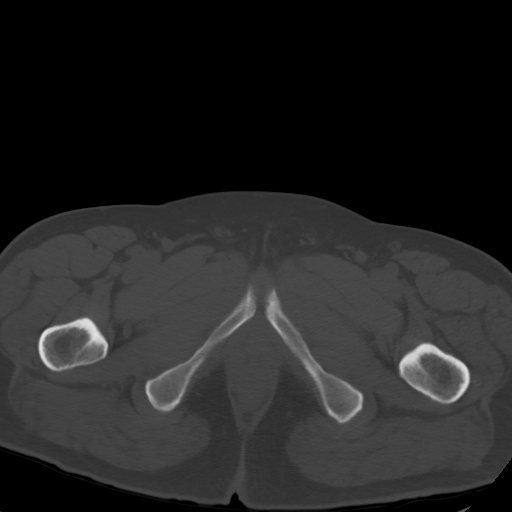
[im 16/102  soft-tissue]
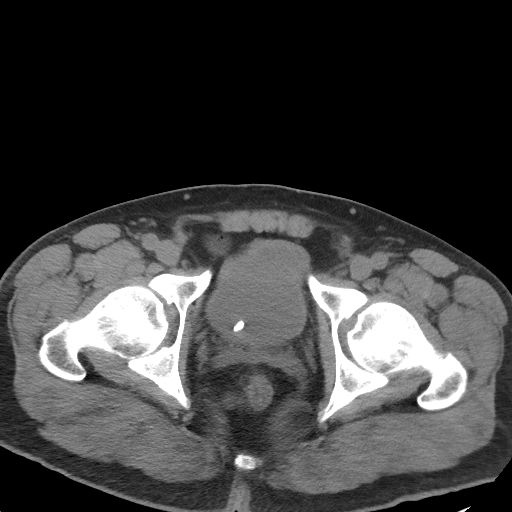
[im 21/102  soft-tissue]
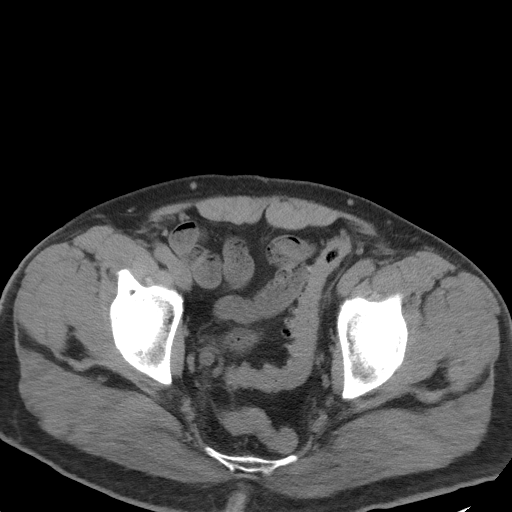
[im 31/102  soft-tissue]
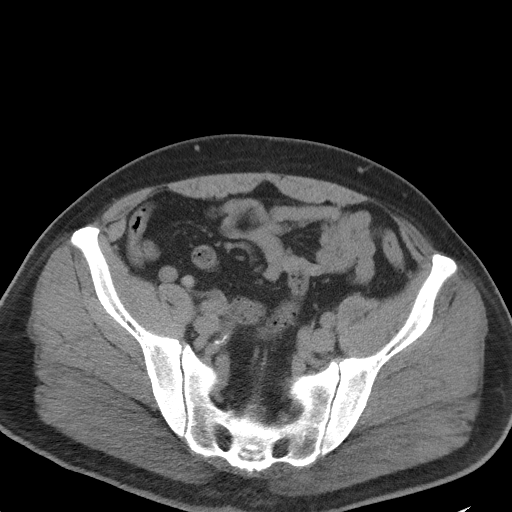
[im 36/102  soft-tissue]
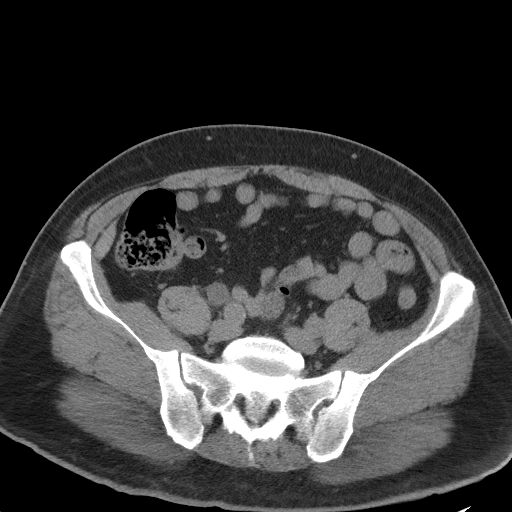
[im 46/102  soft-tissue]
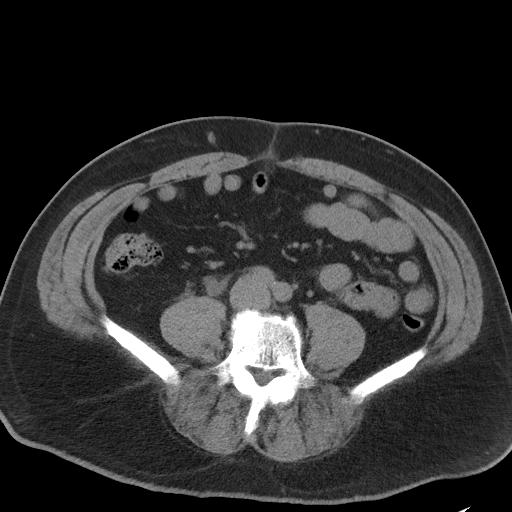
[im 51/102  soft-tissue]
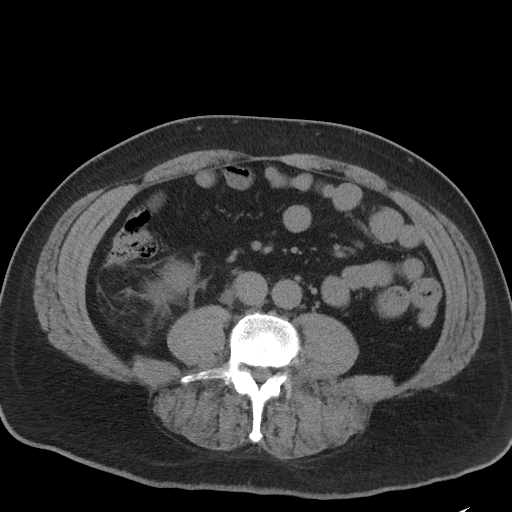
[im 56/102  soft-tissue]
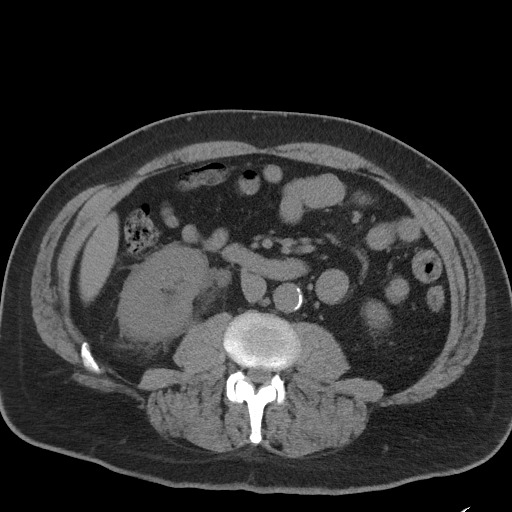
[im 66/102  soft-tissue]
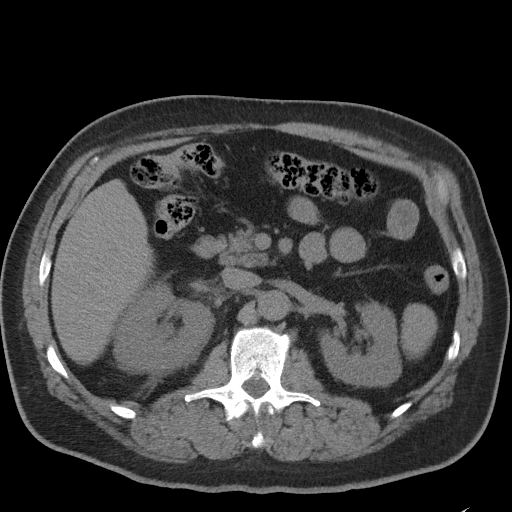
[im 66/102  bone]
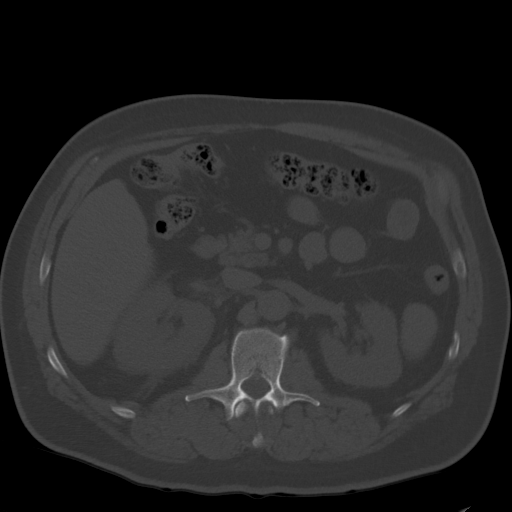
[im 71/102  soft-tissue]
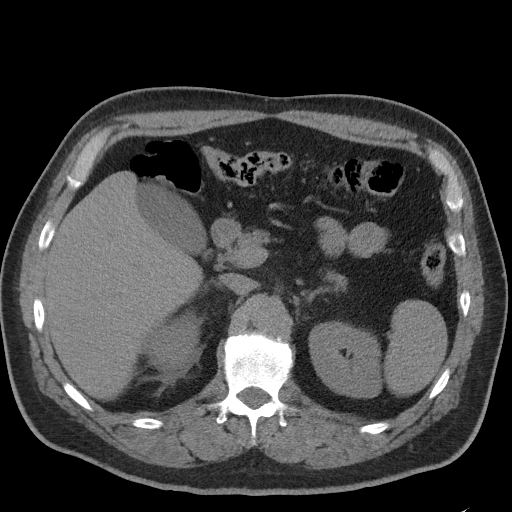
[im 81/102  soft-tissue]
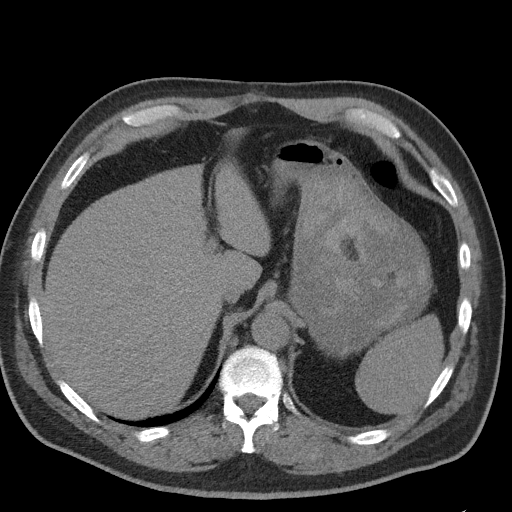
[im 86/102  soft-tissue]
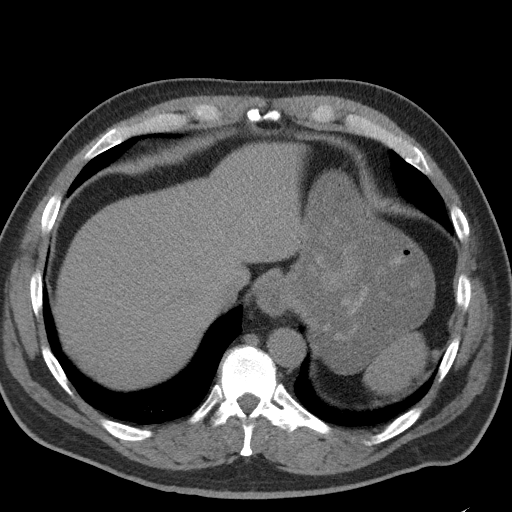
[im 96/102  soft-tissue]
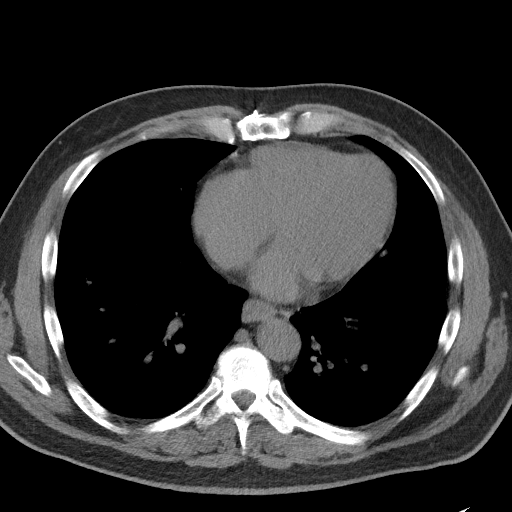

[Series 5: coronal st · coronal · 0.85mm/px · 3 of 88 slices shown]
[im 30/88  soft-tissue]
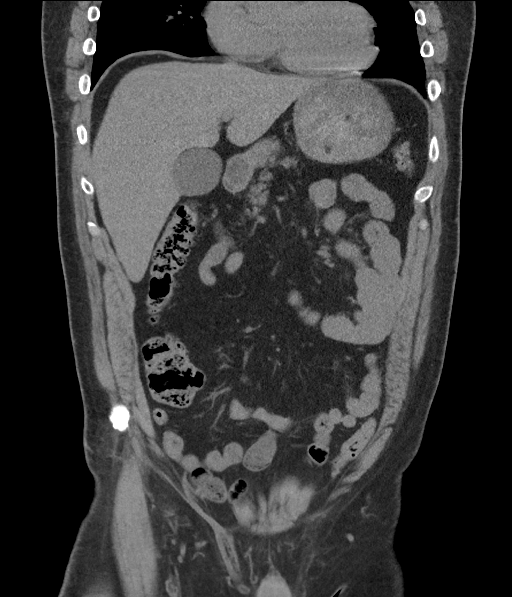
[im 39/88  soft-tissue]
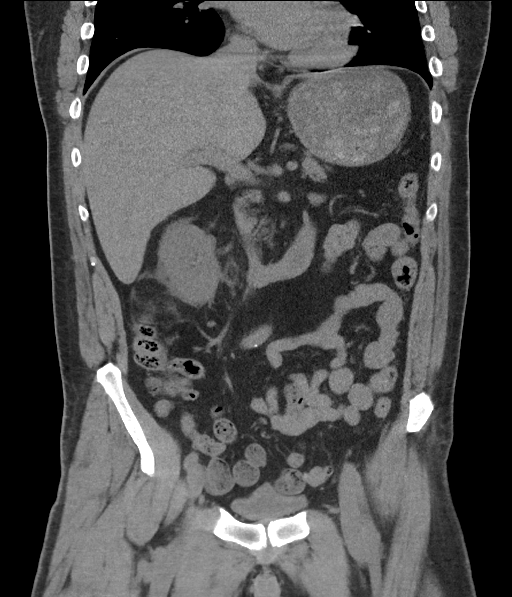
[im 49/88  soft-tissue]
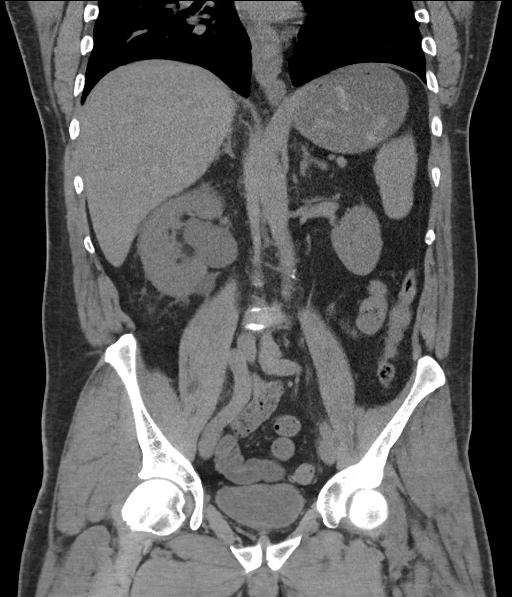

[16 of 46 positions shown; findings below may reference images not displayed]

FINDINGS: Lower chest: Mild dependent atelectasis in the bilateral lower
lobes.

Hepatobiliary: Unenhanced liver is unremarkable.

Gallbladder is unremarkable. No intrahepatic or extrahepatic ductal
dilatation.

Pancreas: Within normal limits.

Spleen: Within normal limits.

Adrenals/Urinary Tract: Adrenal glands are within normal limits.

Left kidney is within normal limits.

Right kidney is notable for perinephric fluid/stranding and moderate
right hydroureteronephrosis. Prior distal right ureteral calculus
was previously at the level of the sacral ureter and is now at the
UVJ, measuring 9 mm (series 2/image 87).

Bladder is within normal limits.

Stomach/Bowel: Stomach is notable for a small hiatal hernia.

No evidence of bowel obstruction.

Normal appendix (series 2/image 70).

Sigmoid diverticulosis, without evidence of diverticulitis.

Vascular/Lymphatic: No evidence of abdominal aortic aneurysm.

Atherosclerotic calcifications of the abdominal aorta and branch
vessels.

Without lymph nodes

Reproductive: Prostate is unremarkable.

Other: No abdominopelvic ascites.

Musculoskeletal: Mild degenerative changes of the lumbar spine.
Median sternotomy.
IMPRESSION: 9 mm calculus at the right UVJ. Associated moderate right
hydroureteronephrosis.
